# Patient Record
Sex: Female | Born: 2000 | Race: White | Hispanic: No | Marital: Single
Health system: Southern US, Community
[De-identification: ages and names within clinical notes are randomized; demographics above are authoritative.]

---
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
## 2020-09-15 IMAGING — US US ABDOMEN LIMITED
1 series · 14 of 25 positions shown · non-contrast
Comparison: None.

CLINICAL DATA: Abdominal pain.

EXAM:
ULTRASOUND ABDOMEN LIMITED RIGHT UPPER QUADRANT

[Series 1: us abdomen limited · 14 of 42 slices shown]
[im 1/42]
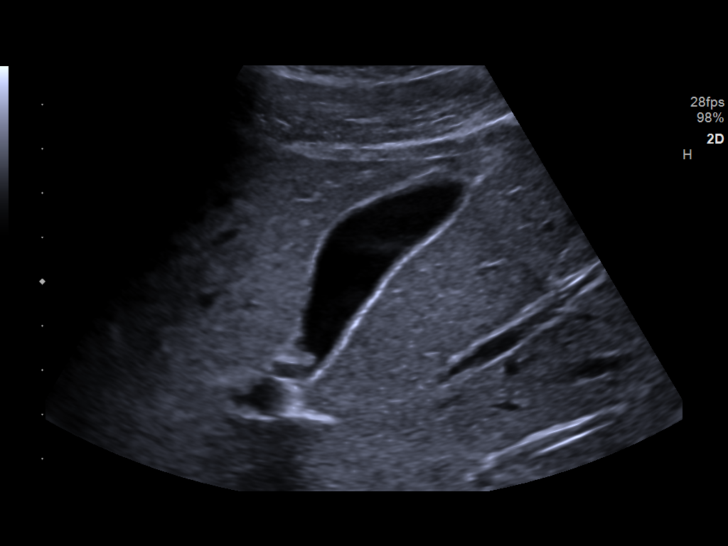
[im 4/42]
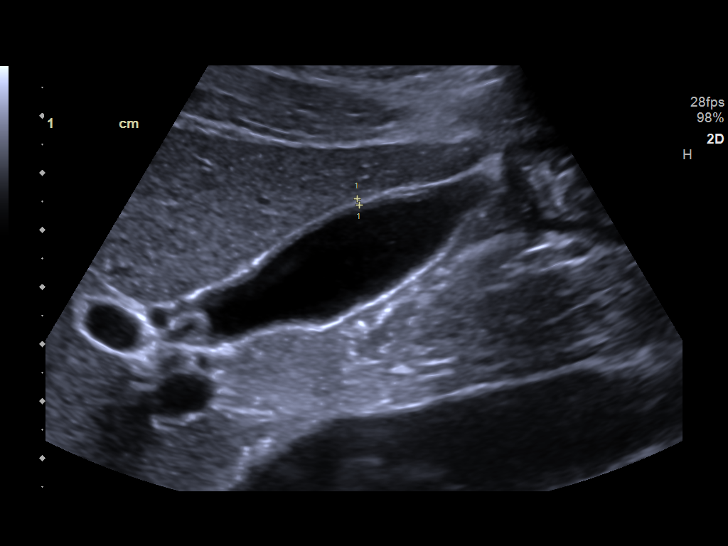
[im 7/42]
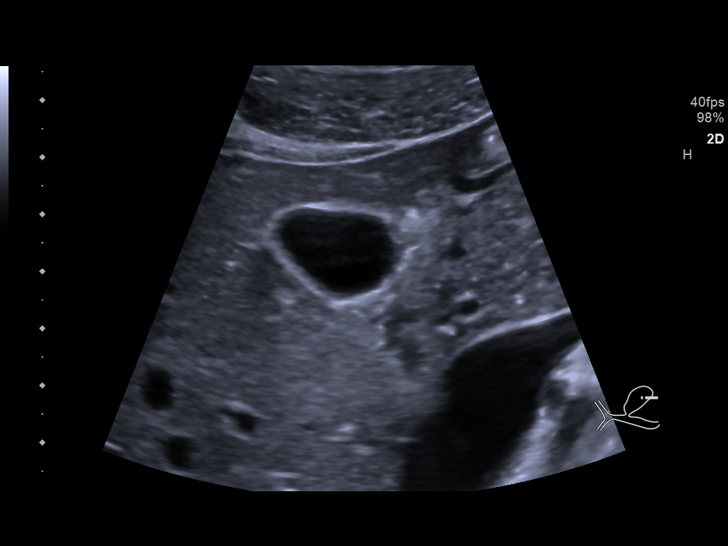
[im 11/42]
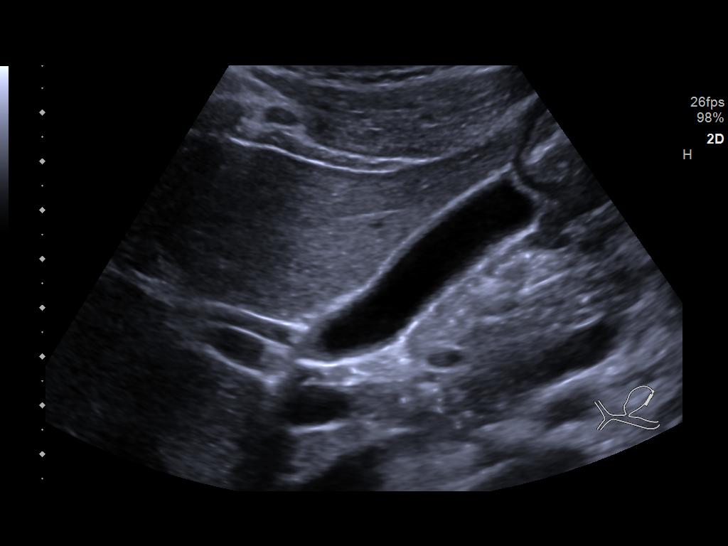
[im 14/42]
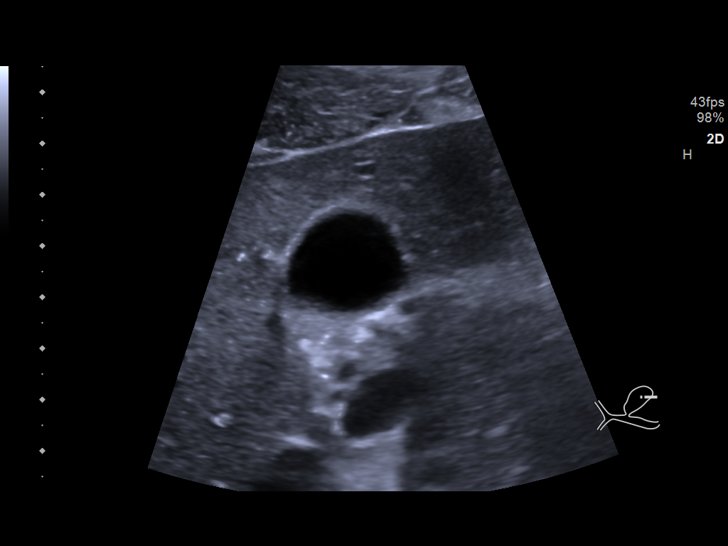
[im 16/42]
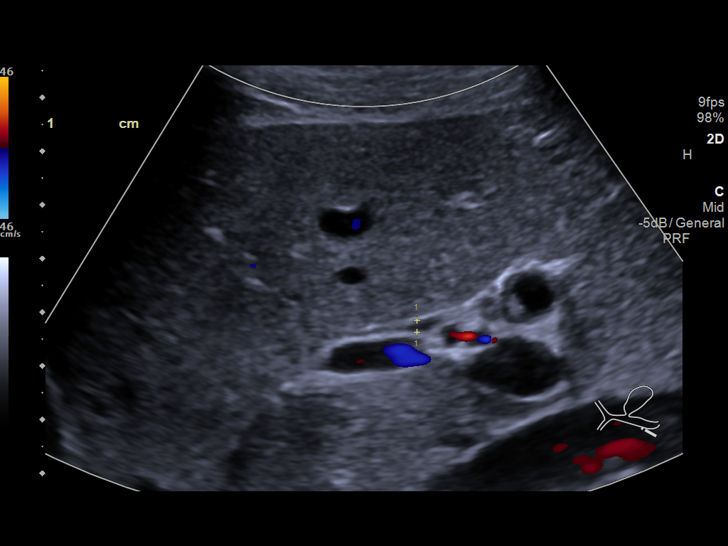
[im 19/42]
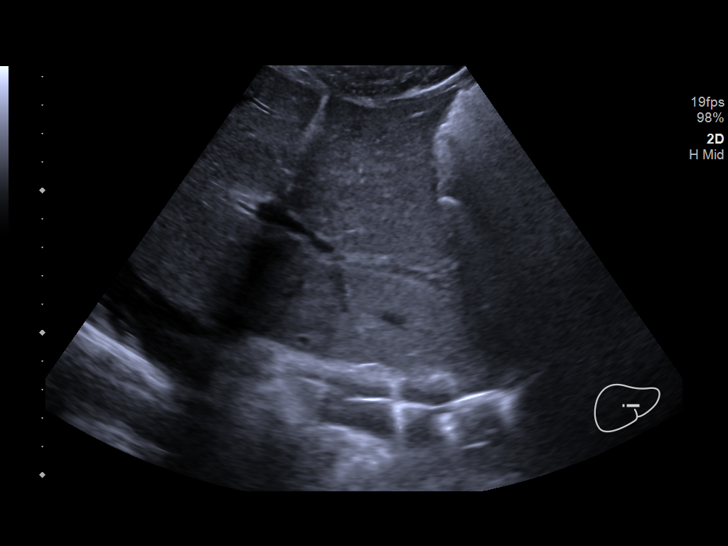
[im 23/42]
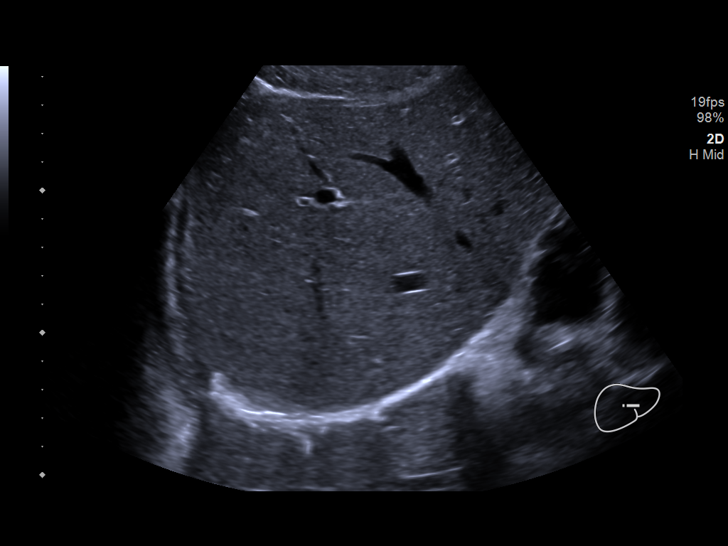
[im 26/42]
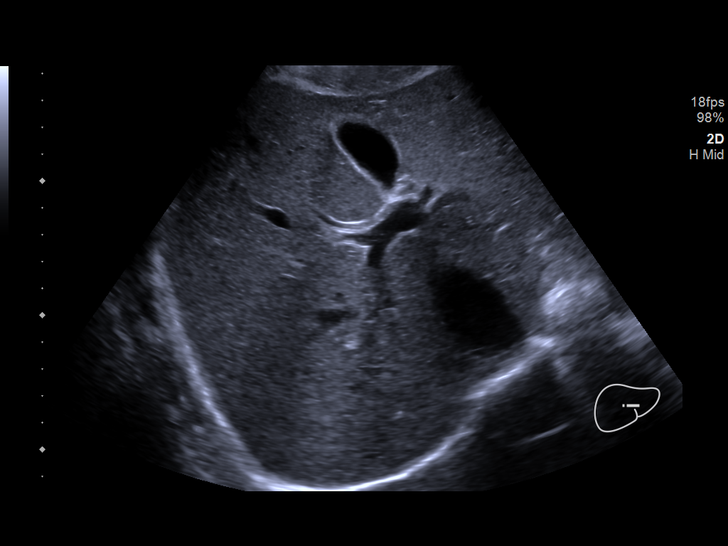
[im 28/42]
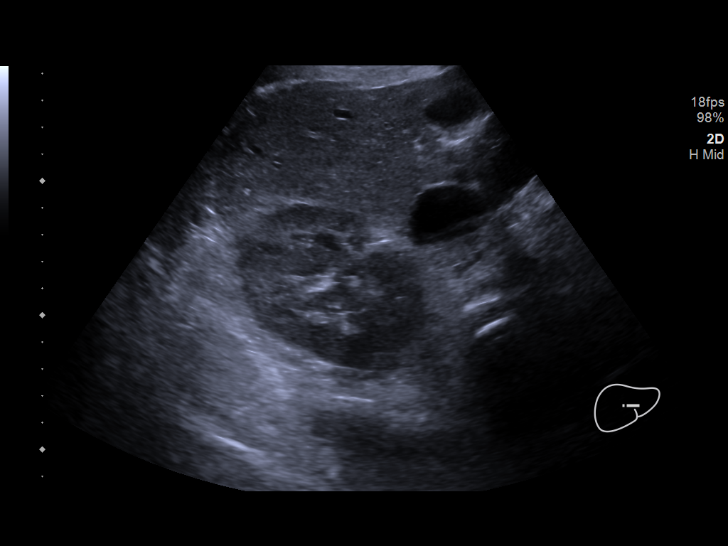
[im 31/42]
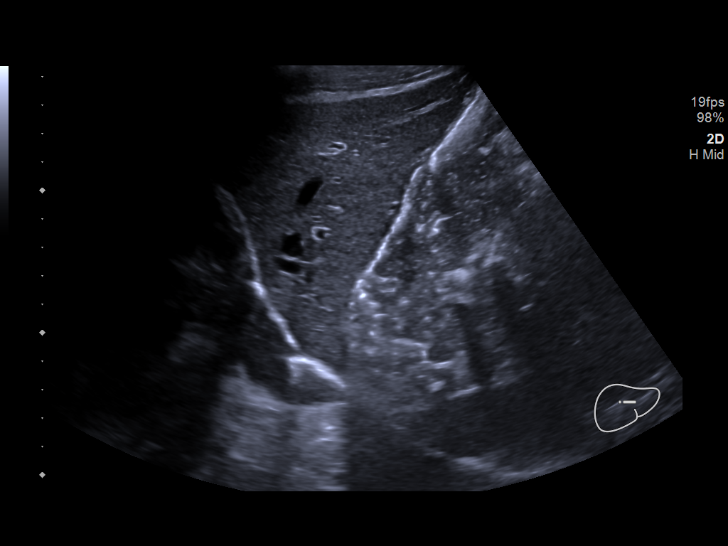
[im 35/42]
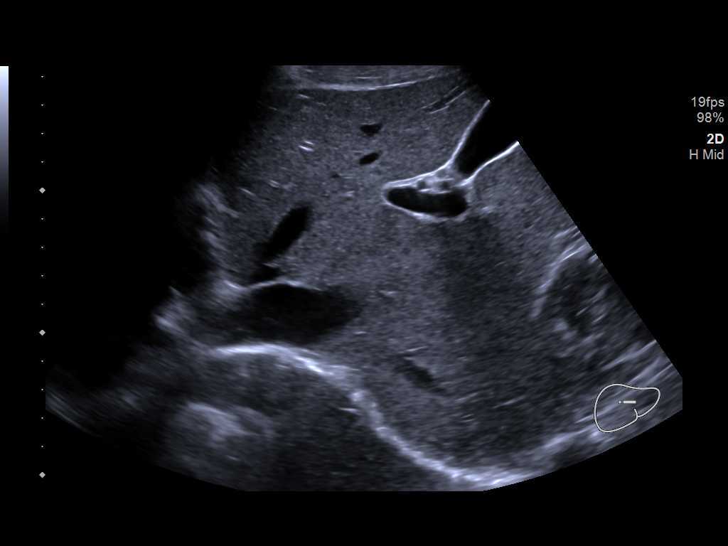
[im 38/42]
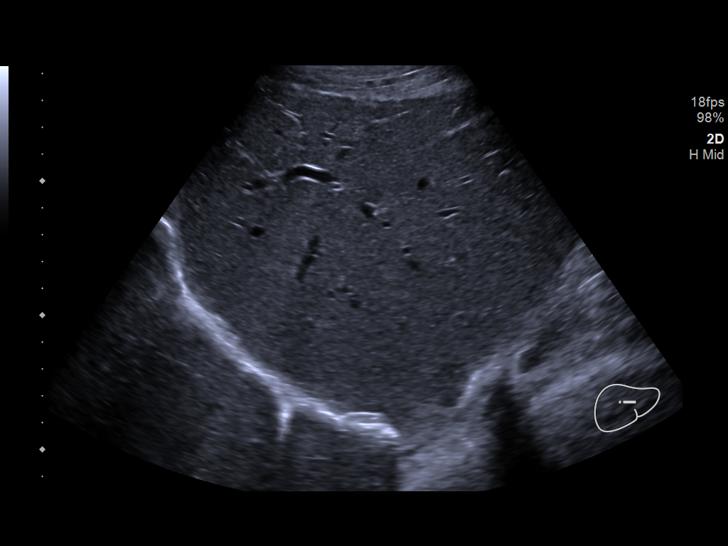
[im 42/42]
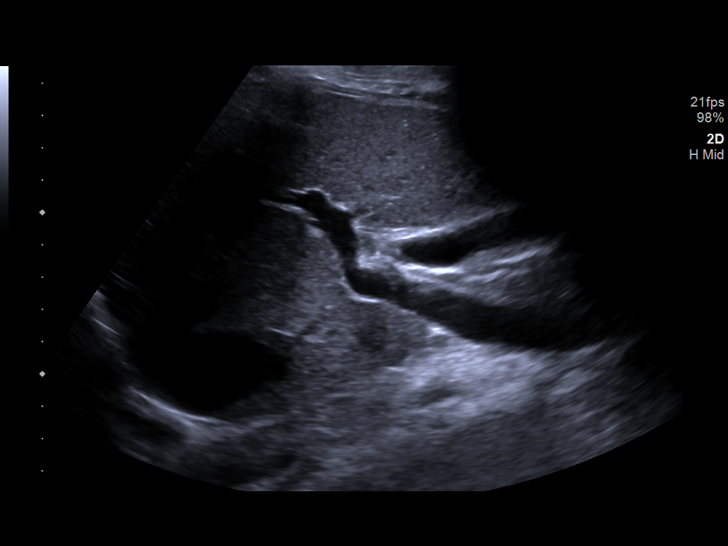

[14 of 25 positions shown; findings below may reference images not displayed]

FINDINGS: Gallbladder:

No gallstones or wall thickening visualized. No sonographic Murphy
sign noted by sonographer.

Common bile duct:

Diameter: 3 mm

Liver:

No focal lesion identified. Within normal limits in parenchymal
echogenicity. Portal vein is patent on color Doppler imaging with
normal direction of blood flow towards the liver.

Other: None.
IMPRESSION: Normal study.
